# Patient Record
Sex: Male | Born: 1990 | Race: Black or African American | Hispanic: No | Marital: Married | Smoking: Never smoker
Health system: Southern US, Community
[De-identification: ages and names within clinical notes are randomized; demographics above are authoritative.]

---
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
## 2013-09-01 ENCOUNTER — Emergency Department (HOSPITAL_COMMUNITY)
Admission: EM | Admit: 2013-09-01 | Discharge: 2013-09-01 | Disposition: A | Discharge status: Home / Self Care | Payer: Self-pay | Attending: Emergency Medicine | Admitting: Emergency Medicine

## 2013-09-01 ENCOUNTER — Encounter (HOSPITAL_COMMUNITY): Payer: Self-pay | Admitting: Emergency Medicine

## 2013-09-01 ENCOUNTER — Emergency Department (HOSPITAL_COMMUNITY): Payer: Self-pay

## 2013-09-01 DIAGNOSIS — W1789XA Other fall from one level to another, initial encounter: Secondary | ICD-10-CM | POA: Insufficient documentation

## 2013-09-01 DIAGNOSIS — Y939 Activity, unspecified: Secondary | ICD-10-CM | POA: Insufficient documentation

## 2013-09-01 DIAGNOSIS — Y9289 Other specified places as the place of occurrence of the external cause: Secondary | ICD-10-CM | POA: Insufficient documentation

## 2013-09-01 DIAGNOSIS — IMO0002 Reserved for concepts with insufficient information to code with codable children: Secondary | ICD-10-CM | POA: Insufficient documentation

## 2013-09-01 DIAGNOSIS — S8392XA Sprain of unspecified site of left knee, initial encounter: Secondary | ICD-10-CM

## 2013-09-01 MED ORDER — HYDROCODONE-ACETAMINOPHEN 5-325 MG PO TABS: ORAL_TABLET | ORAL | Status: DC

## 2013-09-01 MED ORDER — IBUPROFEN 400 MG PO TABS
800.0000 mg | ORAL_TABLET | Freq: Once | ORAL | Status: AC
  Administered 2013-09-01: 800 mg via ORAL
  Filled 2013-09-01: qty 2

## 2013-09-01 MED ORDER — HYDROCODONE-ACETAMINOPHEN 5-325 MG PO TABS
1.0000 | ORAL_TABLET | Freq: Once | ORAL | Status: AC
Start: 2013-09-01 — End: 2013-09-01
  Administered 2013-09-01: 1 via ORAL
  Filled 2013-09-01: qty 1

## 2013-09-01 NOTE — ED Provider Notes (Signed)
CSN: [id number]     Arrival date & time 09/01/13  1511 History  This chart was scribed for non-physician practitioner, Wynetta EmeryNicole Timothee Gali, PA-C working with Glynn OctaveStephen Rancour, MD by Greggory StallionKayla Andersen, ED scribe. This patient was seen in room TR07C/TR07C and the patient's care was started at 4:26 PM.   Chief Complaint  Patient presents with  . Knee Pain   The history is provided by the patient. No language interpreter was used.   HPI Comments: Matthew Clay is a 23 y.o. male who presents to the Emergency Department complaining of left knee injury that occurred 3 days ago. Pt states he fell 3 feet off of a porch and injured his knee during the fall. Denies hitting his head or LOC. He has sudden onset pain with associated mild swelling. Rates pain 5/10. Bearing weight worsens the pain. Pt has not done anything for his symptoms at home. Denies neck pain, back pain.   History reviewed. No pertinent past medical history. History reviewed. No pertinent past surgical history. History reviewed. No pertinent family history. History  Substance Use Topics  . Smoking status: Never Smoker   . Smokeless tobacco: Not on file  . Alcohol Use: No    Review of Systems  Musculoskeletal: Positive for arthralgias and joint swelling. Negative for back pain and neck pain.  All other systems reviewed and are negative.  Allergies  Review of patient's allergies indicates no known allergies.  Home Medications   Prior to Admission medications   Not on File   BP 125/75  Pulse 55  Temp(Src) 98.6 F (37 C) (Oral)  Resp 18  Ht 6\' 2"  (1.88 m)  Wt 170 lb (77.111 kg)  BMI 21.82 kg/m2  SpO2 100%  Physical Exam  Nursing note and vitals reviewed. Constitutional: He is oriented to person, place, and time. He appears well-developed and well-nourished. No distress.  HENT:  Head: Normocephalic and atraumatic.  Mouth/Throat: Oropharynx is clear and moist.  Eyes: Conjunctivae and EOM are normal. Pupils are equal,  round, and reactive to light.  Neck: Normal range of motion. Neck supple.  No midline C-spine  tenderness to palpation or step-offs appreciated. Patient has full range of motion without pain.   Cardiovascular: Normal rate, regular rhythm and intact distal pulses.   Pulmonary/Chest: Effort normal and breath sounds normal. No stridor. No respiratory distress. He has no wheezes. He has no rales. He exhibits no tenderness.  Abdominal: Soft. Bowel sounds are normal. There is no tenderness.  Musculoskeletal: Normal range of motion. He exhibits tenderness. He exhibits no edema.       Legs: Left knee:  No deformity, erythema or abrasions. FROM. No effusion or crepitance. Anterior and posterior drawer show no abnormal laxity. Stable to valgus and varus stress. Joint lines are non-tender. Neurovascularly intact. Pt ambulates with non-antalgic gait.    Neurological: He is alert and oriented to person, place, and time.  Psychiatric: He has a normal mood and affect.    ED Course  Procedures (including critical care time)  DIAGNOSTIC STUDIES: Oxygen Saturation is 100% on RA, normal by my interpretation.    COORDINATION OF CARE: 4:29 PM-Discussed treatment plan which includes xray, Vicodin and 800 mg motrin with pt at bedside and pt agreed to plan. Will give pt orthopedic referral and advised him to follow up if symptoms do not resolve.   Labs Review Labs Reviewed - No data to display  Imaging Review Dg Knee Complete 4 Views Left  09/01/2013   CLINICAL DATA:  Left knee pain medially and posteriorly, status post trauma 3 days ago.  EXAM: LEFT KNEE - COMPLETE 4+ VIEW  COMPARISON:  None.  FINDINGS: The bones of the left knee are adequately mineralized. There is no acute fracture nor dislocation. There is no significant joint effusion. There is soft tissue swelling medially.  IMPRESSION: There is mild diffuse soft tissue swelling, but there is no acute fracture or dislocation.   Electronically Signed    By: David  Swazil[place]Jordan   On: 09/01/2013 16:26     EKG Interpretation None      MDM   Final diagnoses:  None    Filed Vitals:   09/01/13 1525  BP: 125/75  Pulse: 55  Temp: 98.6 F (37 C)  TempSrc: Oral  Resp: 18  Height: 6\' 2"  (1.88 m)  Weight: 170 lb (77.111 kg)  SpO2: 100%    Medications  ibuprofen (ADVIL,MOTRIN) tablet 800 mg (800 mg Oral Given 09/01/13 1640)  HYDROcodone-acetaminophen (NORCO/VICODIN) 5-325 MG per tablet 1 tablet (1 tablet Oral Given 09/01/13 1640)    Matthew Clay is a 23 y.o. male presenting with left posterior knee pain s/p fall. XR negative. Neurovascularly intact. Pt given crutches and ace wrap & advised RICE.   Evaluation does not show pathology that would require ongoing emergent intervention or inpatient treatment. Pt is hemodynamically stable and mentating appropriately. Discussed findings and plan with patient/guardian, who agrees with care plan. All questions answered. Return precautions discussed and outpatient follow up given.   Discharge Medication List as of 09/01/2013  4:53 PM    START taking these medications   Details  HYDROcodone-acetaminophen (NORCO/VICODIN) 5-325 MG per tablet Take 1-2 tablets by mouth every 6 hours as needed for pain., Print           I personally performed the services described in this documentation, which was scribed in my presence. The recorded information has been reviewed and is accurate.  Wynetta Emeryicole Khalila Buechner, PA-C 09/01/13 2001

## 2013-09-01 NOTE — ED Notes (Signed)
Declined W/C at D/C and was escorted to lobby by RN. 

## 2013-09-01 NOTE — Discharge Instructions (Signed)
For pain control please take ibuprofen (also known as Motrin or Advil) 800mg  (this is normally 4 over the counter pills) 3 times a day  for 5 days. Take with food to minimize stomach irritation.  Take vicodin for breakthrough pain, do not drink alcohol, drive, care for children or do other critical tasks while taking vicodin.  Do not hesitate to return to the Emergency Department for any new, worsening or concerning symptoms.   If you do not have a primary care doctor you can establish one at the   [facility]: [address] [place] [id number] [phone]  After you establish care. Let them know you were seen in the emergency room. They must obtain records for further management.    Knee Sprain A knee sprain is a tear in one of the strong, fibrous tissues that connect the bones (ligaments) in your knee. The severity of the sprain depends on how much of the ligament is torn. The tear can be either partial or complete. CAUSES  Often, sprains are a result of a fall or injury. The force of the impact causes the fibers of your ligament to stretch too much. This excess tension causes the fibers of your ligament to tear. SIGNS AND SYMPTOMS  You may have some loss of motion in your knee. Other symptoms include:  Bruising.  Pain in the knee area.  Tenderness of the knee to the touch.  Swelling. DIAGNOSIS  To diagnose a knee sprain, your health care provider will physically examine your knee. Your health care provider may also suggest an X-ray exam of your knee to make sure no bones are broken. TREATMENT  If your ligament is only partially torn, treatment usually involves keeping the knee in a fixed position (immobilization) or bracing your knee for activities that require movement for several weeks. To do this, your health care provider will apply a bandage, cast, or splint to keep your knee from moving and to support your knee during movement until it heals. For a  partially torn ligament, the healing process usually takes 4-6 weeks. If your ligament is completely torn, depending on which ligament it is, you may need surgery to reconnect the ligament to the bone or reconstruct it. After surgery, a cast or splint may be applied and will need to stay on your knee for 4-6 weeks while your ligament heals. HOME CARE INSTRUCTIONS  Keep your injured knee elevated to decrease swelling.  To ease pain and swelling, apply ice to the injured area:  Put ice in a plastic bag.  Place a towel between your skin and the bag.  Leave the ice on for 20 minutes, 2-3 times a day.  Only take medicine for pain as directed by your health care provider.  Do not leave your knee unprotected until pain and stiffness go away (usually 4-6 weeks).  If you have a cast or splint, do not allow it to get wet. If you have been instructed not to remove it, cover it with a plastic bag when you shower or bathe. Do not swim.  Your health care provider may suggest exercises for you to do during your recovery to prevent or limit permanent weakness and stiffness. SEEK IMMEDIATE MEDICAL CARE IF:  Your cast or splint becomes damaged.  Your pain becomes worse.  You have significant pain, swelling, or numbness below the cast or splint. MAKE SURE YOU:  Understand these instructions.  Will watch your condition.  Will get help right away  if you are not doing well or get worse. Document Released: 02/12/2005 Document Revised: 12/03/2012 Document Reviewed: 09/24/2012 [facility] Patient Information 2015 [place], [place]. This information is not intended to replace advice given to you by your health care provider. Make sure you discuss any questions you have with your health care provider.

## 2013-09-01 NOTE — ED Notes (Signed)
3 days ago reports falling off porch and injuring left knee. Left knee mild edema, no deformity. Ambulatory. CMS intact. Pain rated 5/10, has not tried anything for pain at home./

## 2013-09-02 NOTE — ED Provider Notes (Signed)
Medical screening examination/treatment/procedure(s) were performed by non-physician practitioner and as supervising physician I was immediately available for consultation/collaboration.   EKG Interpretation None        Adelita Hone, MD 09/02/13 0044 

## 2015-06-27 ENCOUNTER — Emergency Department
Admission: EM | Admit: 2015-06-27 | Discharge: 2015-06-27 | Disposition: A | Discharge status: Home / Self Care | Payer: Self-pay | Attending: Emergency Medicine | Admitting: Emergency Medicine

## 2015-06-27 ENCOUNTER — Encounter (HOSPITAL_COMMUNITY): Payer: Self-pay | Admitting: *Deleted

## 2015-06-27 ENCOUNTER — Encounter: Payer: Self-pay | Admitting: Emergency Medicine

## 2015-06-27 ENCOUNTER — Emergency Department (HOSPITAL_COMMUNITY)
Admission: EM | Admit: 2015-06-27 | Discharge: 2015-06-27 | Disposition: A | Discharge status: Home / Self Care | Payer: Self-pay | Attending: Emergency Medicine | Admitting: Emergency Medicine

## 2015-06-27 DIAGNOSIS — R109 Unspecified abdominal pain: Secondary | ICD-10-CM | POA: Insufficient documentation

## 2015-06-27 DIAGNOSIS — R197 Diarrhea, unspecified: Secondary | ICD-10-CM | POA: Insufficient documentation

## 2015-06-27 DIAGNOSIS — R112 Nausea with vomiting, unspecified: Secondary | ICD-10-CM | POA: Insufficient documentation

## 2015-06-27 DIAGNOSIS — K529 Noninfective gastroenteritis and colitis, unspecified: Secondary | ICD-10-CM | POA: Insufficient documentation

## 2015-06-27 LAB — COMPREHENSIVE METABOLIC PANEL
ALT: 33 U/L (ref 17–63)
ALT: 35 U/L (ref 17–63)
ANION GAP: 14 (ref 5–15)
AST: 42 U/L — ABNORMAL HIGH (ref 15–41)
AST: 47 U/L — ABNORMAL HIGH (ref 15–41)
Albumin: 5.3 g/dL — ABNORMAL HIGH (ref 3.5–5.0)
Albumin: 5.7 g/dL — ABNORMAL HIGH (ref 3.5–5.0)
Alkaline Phosphatase: 50 U/L (ref 38–126)
Alkaline Phosphatase: 50 U/L (ref 38–126)
Anion gap: 14 (ref 5–15)
BILIRUBIN TOTAL: 1.5 mg/dL — AB (ref 0.3–1.2)
BUN: 13 mg/dL (ref 6–20)
BUN: 18 mg/dL (ref 6–20)
CO2: 24 mmol/L (ref 22–32)
CO2: 21 mmol/L — AB (ref 22–32)
Calcium: 10.6 mg/dL — ABNORMAL HIGH (ref 8.9–10.3)
Calcium: 10.7 mg/dL — ABNORMAL HIGH (ref 8.9–10.3)
Chloride: 104 mmol/L (ref 101–111)
Chloride: 105 mmol/L (ref 101–111)
Creatinine, Ser: 1.3 mg/dL — ABNORMAL HIGH (ref 0.61–1.24)
Creatinine, Ser: 1.49 mg/dL — ABNORMAL HIGH (ref 0.61–1.24)
GFR calc non Af Amer: >60 mL/min (ref >60)
GLUCOSE: 135 mg/dL — AB (ref 65–99)
Glucose, Bld: 148 mg/dL — ABNORMAL HIGH (ref 65–99)
POTASSIUM: 4.2 mmol/L (ref 3.5–5.1)
Potassium: 4.3 mmol/L (ref 3.5–5.1)
Sodium: 142 mmol/L (ref 135–145)
Sodium: 140 mmol/L (ref 135–145)
TOTAL PROTEIN: 9.2 g/dL — AB (ref 6.5–8.1)
Total Bilirubin: 1.6 mg/dL — ABNORMAL HIGH (ref 0.3–1.2)
Total Protein: 9.4 g/dL — ABNORMAL HIGH (ref 6.5–8.1)

## 2015-06-27 LAB — BASIC METABOLIC PANEL
Anion gap: 9 (ref 5–15)
BUN: 18 mg/dL (ref 6–20)
CHLORIDE: 110 mmol/L (ref 101–111)
CO2: 19 mmol/L — ABNORMAL LOW (ref 22–32)
CREATININE: 1.11 mg/dL (ref 0.61–1.24)
Calcium: 9.3 mg/dL (ref 8.9–10.3)
GFR calc Af Amer: >60 mL/min (ref >60)
GFR calc non Af Amer: >60 mL/min (ref >60)
Glucose, Bld: 113 mg/dL — ABNORMAL HIGH (ref 65–99)
Potassium: 4.9 mmol/L (ref 3.5–5.1)
SODIUM: 138 mmol/L (ref 135–145)

## 2015-06-27 LAB — CBC
HCT: 48.5 % (ref 39.0–52.0)
HCT: 48.2 % (ref 40.0–52.0)
HEMOGLOBIN: 17.2 g/dL — AB (ref 13.0–17.0)
HEMOGLOBIN: 16.4 g/dL (ref 13.0–18.0)
MCH: 31.9 pg (ref 26.0–34.0)
MCH: 30.9 pg (ref 26.0–34.0)
MCHC: 35.5 g/dL (ref 30.0–36.0)
MCHC: 34.1 g/dL (ref 32.0–36.0)
MCV: 90 fL (ref 78.0–100.0)
MCV: 90.5 fL (ref 80.0–100.0)
PLATELETS: 349 10*3/uL (ref 150–400)
Platelets: 310 10*3/uL (ref 150–440)
RBC: 5.39 MIL/uL (ref 4.22–5.81)
RBC: 5.32 MIL/uL (ref 4.40–5.90)
RDW: 12.2 % (ref 11.5–15.5)
RDW: 13.1 % (ref 11.5–14.5)
WBC: 10.7 10*3/uL — ABNORMAL HIGH (ref 4.0–10.5)
WBC: 15.6 10*3/uL — AB (ref 3.8–10.6)

## 2015-06-27 LAB — LIPASE, BLOOD
Lipase: 25 U/L (ref 11–51)
Lipase: 19 U/L (ref 11–51)

## 2015-06-27 MED ORDER — SODIUM CHLORIDE 0.9 % IV BOLUS (SEPSIS)
1000.0000 mL | Freq: Once | INTRAVENOUS | Status: AC
  Administered 2015-06-27: 1000 mL via INTRAVENOUS

## 2015-06-27 MED ORDER — ONDANSETRON HCL 4 MG PO TABS: 4.0000 mg | ORAL_TABLET | Freq: Every day | ORAL | Status: AC | PRN

## 2015-06-27 MED ORDER — ONDANSETRON HCL 4 MG/2ML IJ SOLN
4.0000 mg | Freq: Once | INTRAMUSCULAR | Status: AC
  Administered 2015-06-27: 4 mg via INTRAVENOUS
  Filled 2015-06-27: qty 2

## 2015-06-27 MED ORDER — ACETAMINOPHEN 325 MG PO TABS
650.0000 mg | ORAL_TABLET | Freq: Once | ORAL | Status: AC
  Administered 2015-06-27: 650 mg via ORAL
  Filled 2015-06-27: qty 2

## 2015-06-27 NOTE — ED Provider Notes (Signed)
[facility] Emergency Department Provider Note  ____________________________________________   I have reviewed the triage vital signs and the nursing notes.   HISTORY  Chief Complaint Back Pain and Abdominal Pain    HPI Matthew Clay is a 25 y.o. male who has no medical history he states, presents here with nausea vomiting diarrhea since last night. He is having "watery" diarrhea plus copious nonbloody nonbilious vomiting. No recent antibiotics. His cramping abdominal discomfort with it. Lasted for about 10-12 hours so far. No known history of diabetes or renal insufficiency, despite the last visit. Patient denies any headache stiff neck fever chills or rash. Started after eating yesterday      History reviewed. No pertinent past medical history.  Patient Active Problem List   Diagnosis Date Noted  . CONTUSION OF SHOULDER REGION 11/30/2009    History reviewed. No pertinent past surgical history.  Current Outpatient Rx  Name  Route  Sig  Dispense  Refill  . HYDROcodone-acetaminophen (NORCO/VICODIN) 5-325 MG per tablet      Take 1-2 tablets by mouth every 6 hours as needed for pain.   15 tablet   0     Allergies Review of patient's allergies indicates no known allergies.  No family history on file.  Social History Social History  Substance Use Topics  . Smoking status: Never Smoker   . Smokeless tobacco: None  . Alcohol Use: No    Review of Systems Constitutional: No fever/chills Eyes: No visual changes. ENT: No sore throat. No stiff neck no neck pain Cardiovascular: Denies chest pain. Respiratory: Denies shortness of breath. Gastrointestinal:   See history of present illness Genitourinary: Negative for dysuria. Musculoskeletal: Negative lower extremity swelling Skin: Negative for rash. Neurological: Negative for headaches, focal weakness or numbness. 10-point ROS otherwise  negative.  ____________________________________________   PHYSICAL EXAM:  VITAL SIGNS: ED Triage Vitals  Enc Vitals Group     BP 06/27/15 0735 119/71 mmHg     Pulse Rate 06/27/15 0735 70     Resp 06/27/15 0735 18     Temp 06/27/15 0735 97.8 F (36.6 C)     Temp Source 06/27/15 0735 Oral     SpO2 06/27/15 0735 98 %     Weight 06/27/15 0735 170 lb (77.111 kg)     Height 06/27/15 0735 6\' 2"  (1.88 m)     Head Cir --      Peak Flow --      Pain Score 06/27/15 0743 10     Pain Loc --      Pain Edu? --      Excl. in GC? --     Constitutional: Alert and oriented. Well appearing and in no acute distress. Eyes: Conjunctivae are normal. PERRL. EOMI. Head: Atraumatic. Nose: No congestion/rhinnorhea. Mouth/Throat: Mucous membranes are moist.  Oropharynx non-erythematous. Neck: No stridor.   Nontender with no meningismus Cardiovascular: Normal rate, regular rhythm. Grossly normal heart sounds.  Good peripheral circulation. Respiratory: Normal respiratory effort.  No retractions. Lungs CTAB. Abdominal: Soft and nontender. No distention. No guarding no rebound Back:  There is no focal tenderness or step off there is no midline tenderness there are no lesions noted. there is no CVA tenderness Musculoskeletal: No lower extremity tenderness. No joint effusions, no DVT signs strong distal pulses no edema Neurologic:  Normal speech and language. No gross focal neurologic deficits are appreciated.  Skin:  Skin is warm, dry and intact. No rash noted. Psychiatric: Mood and affect are normal.  Speech and behavior are normal.  ____________________________________________   LABS (all labs ordered are listed, but only abnormal results are displayed)  Labs Reviewed  COMPREHENSIVE METABOLIC PANEL - Abnormal; Notable for the following:    CO2 21 (*)    Glucose, Bld 148 (*)    Creatinine, Ser 1.49 (*)    Calcium 10.7 (*)    Total Protein 9.4 (*)    Albumin 5.7 (*)    AST 47 (*)    Total  Bilirubin 1.6 (*)    All other components within normal limits  CBC - Abnormal; Notable for the following:    WBC 15.6 (*)    All other components within normal limits  LIPASE, BLOOD  URINALYSIS COMPLETEWITH MICROSCOPIC ([facility] ONLY)   ____________________________________________  EKG  I personally interpreted any EKGs ordered by me or triage  ____________________________________________  RADIOLOGY  I reviewed any imaging ordered by me or triage that were performed during my shift and, if possible, patient and/or family made aware of any abnormal findings. ____________________________________________   PROCEDURES  Procedure(s) performed: None  Critical Care performed: None  ____________________________________________   INITIAL IMPRESSION / ASSESSMENT AND PLAN / ED COURSE  Pertinent labs & imaging results that were available during my care of the patient were reviewed by me and considered in my medical decision making (see chart for details).  Very well-appearing gentleman with no focal abdominal discomfort presents today with nausea vomiting diarrhea which is likely viral or food poisoning related. There is no evidence of this time of acute intra-abdominal pathology such as diverticulitis or appendicitis or gallbladder disease. His exam is benign. We will give him IV fluids and reassess ____________________________________________   FINAL CLINICAL IMPRESSION(S) / ED DIAGNOSES  Final diagnoses:  None      This chart was dictated using voice recognition software.  Despite best efforts to proofread,  errors can occur which can change meaning.     Jeanmarie Plant, MD 06/27/15 1009

## 2015-06-27 NOTE — ED Notes (Signed)
Pt reports umbilical pain that started at 2200 last night; reports lower back pain started at 0400 this morning. Pt reports vomiting and diarrhea.

## 2015-06-27 NOTE — ED Notes (Signed)
Pt given warm blankets for back pain; reports unable to urinate at this time.

## 2015-06-27 NOTE — ED Notes (Signed)
Abd pain N/V/D that started after he went to bed

## 2015-08-14 ENCOUNTER — Emergency Department (HOSPITAL_COMMUNITY): Payer: Self-pay

## 2015-08-14 ENCOUNTER — Emergency Department (HOSPITAL_COMMUNITY)
Admission: EM | Admit: 2015-08-14 | Discharge: 2015-08-15 | Disposition: A | Discharge status: Home / Self Care | Payer: Self-pay | Attending: Emergency Medicine | Admitting: Emergency Medicine

## 2015-08-14 ENCOUNTER — Encounter (HOSPITAL_COMMUNITY): Payer: Self-pay | Admitting: *Deleted

## 2015-08-14 DIAGNOSIS — Y929 Unspecified place or not applicable: Secondary | ICD-10-CM | POA: Insufficient documentation

## 2015-08-14 DIAGNOSIS — W2201XA Walked into wall, initial encounter: Secondary | ICD-10-CM | POA: Insufficient documentation

## 2015-08-14 DIAGNOSIS — Y999 Unspecified external cause status: Secondary | ICD-10-CM | POA: Insufficient documentation

## 2015-08-14 DIAGNOSIS — S60511A Abrasion of right hand, initial encounter: Secondary | ICD-10-CM | POA: Insufficient documentation

## 2015-08-14 DIAGNOSIS — Y9389 Activity, other specified: Secondary | ICD-10-CM | POA: Insufficient documentation

## 2015-08-14 DIAGNOSIS — L089 Local infection of the skin and subcutaneous tissue, unspecified: Secondary | ICD-10-CM

## 2015-08-14 MED ORDER — SODIUM CHLORIDE 0.9 % IV SOLN
3.0000 g | Freq: Once | INTRAVENOUS | Status: AC
  Administered 2015-08-15: 3 g via INTRAVENOUS
  Filled 2015-08-14: qty 3

## 2015-08-14 MED ORDER — MORPHINE SULFATE (PF) 4 MG/ML IV SOLN
4.0000 mg | Freq: Once | INTRAVENOUS | Status: DC
  Filled 2015-08-14: qty 1

## 2015-08-14 MED ORDER — ONDANSETRON HCL 4 MG/2ML IJ SOLN
4.0000 mg | Freq: Once | INTRAMUSCULAR | Status: AC
Start: 2015-08-14 — End: 2015-08-15
  Administered 2015-08-15: 4 mg via INTRAVENOUS
  Filled 2015-08-14: qty 2

## 2015-08-14 NOTE — ED Notes (Signed)
Pt c/o right hand pain after hitting a wall night, has swelling noted to middle finger on right hand,
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
## 2015-08-15 LAB — BASIC METABOLIC PANEL
ANION GAP: 6 (ref 5–15)
BUN: 17 mg/dL (ref 6–20)
CO2: 27 mmol/L (ref 22–32)
Calcium: 9 mg/dL (ref 8.9–10.3)
Chloride: 103 mmol/L (ref 101–111)
Creatinine, Ser: 1.05 mg/dL (ref 0.61–1.24)
GFR calc non Af Amer: >60 mL/min (ref >60)
Glucose, Bld: 77 mg/dL (ref 65–99)
POTASSIUM: 4 mmol/L (ref 3.5–5.1)
Sodium: 136 mmol/L (ref 135–145)

## 2015-08-15 LAB — CBC WITH DIFFERENTIAL/PLATELET
BASOS ABS: 0 10*3/uL (ref 0.0–0.1)
Basophils Relative: 0 %
Eosinophils Absolute: 0.1 10*3/uL (ref 0.0–0.7)
Eosinophils Relative: 1 %
HCT: 41.5 % (ref 39.0–52.0)
HEMOGLOBIN: 14.5 g/dL (ref 13.0–17.0)
Lymphocytes Relative: 21 %
Lymphs Abs: 1.6 10*3/uL (ref 0.7–4.0)
MCH: 32.1 pg (ref 26.0–34.0)
MCHC: 34.9 g/dL (ref 30.0–36.0)
MCV: 91.8 fL (ref 78.0–100.0)
MONOS PCT: 11 %
Monocytes Absolute: 0.8 10*3/uL (ref 0.1–1.0)
Neutro Abs: 5.2 10*3/uL (ref 1.7–7.7)
Neutrophils Relative %: 67 %
Platelets: 247 10*3/uL (ref 150–400)
RBC: 4.52 MIL/uL (ref 4.22–5.81)
RDW: 13 % (ref 11.5–15.5)
WBC: 7.7 10*3/uL (ref 4.0–10.5)

## 2015-08-15 MED ORDER — AMOXICILLIN-POT CLAVULANATE 125-31.25 MG/5ML PO SUSR: 875.0000 mg | Freq: Two times a day (BID) | ORAL | Status: AC

## 2015-08-15 MED ORDER — BACITRACIN ZINC 500 UNIT/GM EX OINT
TOPICAL_OINTMENT | CUTANEOUS | Status: AC
  Filled 2015-08-15: qty 0.9

## 2015-08-15 MED ORDER — TETANUS-DIPHTH-ACELL PERTUSSIS 5-2.5-18.5 LF-MCG/0.5 IM SUSP
0.5000 mL | Freq: Once | INTRAMUSCULAR | Status: AC
  Administered 2015-08-15: 0.5 mL via INTRAMUSCULAR
  Filled 2015-08-15: qty 0.5

## 2015-08-15 MED ORDER — POVIDONE-IODINE 10 % EX SOLN
CUTANEOUS | Status: AC
  Filled 2015-08-15: qty 118

## 2015-08-15 MED ORDER — HYDROCODONE-ACETAMINOPHEN 5-325 MG PO TABS: ORAL_TABLET | ORAL | Status: DC

## 2015-08-15 MED ORDER — LIDOCAINE-EPINEPHRINE (PF) 2 %-1:200000 IJ SOLN
20.0000 mL | Freq: Once | INTRAMUSCULAR | Status: DC
  Filled 2015-08-15: qty 20

## 2015-08-15 MED ORDER — HYDROCODONE-ACETAMINOPHEN 7.5-325 MG/15ML PO SOLN
10.0000 mL | Freq: Four times a day (QID) | ORAL | Status: AC | PRN
Start: 2015-08-15 — End: ?

## 2015-08-15 MED ORDER — AMOXICILLIN-POT CLAVULANATE 875-125 MG PO TABS
1.0000 | ORAL_TABLET | Freq: Two times a day (BID) | ORAL | Status: DC
Start: 2015-08-15 — End: 2015-08-15

## 2015-08-15 NOTE — ED Notes (Signed)
Pt denies any pain at present, requesting to hold off on morphine, pt and family updated on plan of care,
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
## 2018-09-12 ENCOUNTER — Other Ambulatory Visit: Payer: Self-pay

## 2018-09-12 DIAGNOSIS — Z20822 Contact with and (suspected) exposure to covid-19: Secondary | ICD-10-CM

## 2018-09-17 LAB — NOVEL CORONAVIRUS, NAA: SARS-CoV-2, NAA: DETECTED — AB
